# Patient Record
Sex: Male | Born: 1980
Health system: Southern US, Community
[De-identification: ages and names within clinical notes are randomized; demographics above are authoritative.]

---

## 2007-11-19 ENCOUNTER — Ambulatory Visit: Payer: Self-pay | Admitting: Family Medicine

## 2014-09-15 DIAGNOSIS — E785 Hyperlipidemia, unspecified: Secondary | ICD-10-CM | POA: Insufficient documentation

## 2016-08-04 DIAGNOSIS — F29 Unspecified psychosis not due to a substance or known physiological condition: Secondary | ICD-10-CM | POA: Insufficient documentation

## 2016-08-15 DIAGNOSIS — G4700 Insomnia, unspecified: Secondary | ICD-10-CM | POA: Diagnosis not present

## 2016-08-16 DIAGNOSIS — G4700 Insomnia, unspecified: Secondary | ICD-10-CM | POA: Diagnosis not present

## 2016-08-17 DIAGNOSIS — G4700 Insomnia, unspecified: Secondary | ICD-10-CM | POA: Diagnosis not present

## 2016-08-18 DIAGNOSIS — F2081 Schizophreniform disorder: Secondary | ICD-10-CM | POA: Insufficient documentation

## 2016-08-18 DIAGNOSIS — G4700 Insomnia, unspecified: Secondary | ICD-10-CM | POA: Diagnosis not present

## 2016-08-20 DIAGNOSIS — G4700 Insomnia, unspecified: Secondary | ICD-10-CM | POA: Diagnosis not present

## 2016-08-21 DIAGNOSIS — G4700 Insomnia, unspecified: Secondary | ICD-10-CM | POA: Diagnosis not present

## 2016-09-06 DIAGNOSIS — Z79899 Other long term (current) drug therapy: Secondary | ICD-10-CM | POA: Diagnosis not present

## 2017-05-17 DIAGNOSIS — R21 Rash and other nonspecific skin eruption: Secondary | ICD-10-CM | POA: Diagnosis not present

## 2017-06-14 DIAGNOSIS — J00 Acute nasopharyngitis [common cold]: Secondary | ICD-10-CM | POA: Diagnosis not present

## 2017-09-05 DIAGNOSIS — Z79899 Other long term (current) drug therapy: Secondary | ICD-10-CM | POA: Diagnosis not present

## 2018-03-22 DIAGNOSIS — R2232 Localized swelling, mass and lump, left upper limb: Secondary | ICD-10-CM | POA: Diagnosis not present

## 2018-03-22 DIAGNOSIS — A689 Relapsing fever, unspecified: Secondary | ICD-10-CM | POA: Diagnosis not present

## 2018-04-05 ENCOUNTER — Other Ambulatory Visit: Payer: Self-pay | Admitting: Orthopedic Surgery

## 2018-04-05 DIAGNOSIS — M25512 Pain in left shoulder: Secondary | ICD-10-CM | POA: Diagnosis not present

## 2018-04-05 DIAGNOSIS — R2232 Localized swelling, mass and lump, left upper limb: Secondary | ICD-10-CM

## 2018-04-05 DIAGNOSIS — G8929 Other chronic pain: Secondary | ICD-10-CM

## 2018-04-09 ENCOUNTER — Other Ambulatory Visit: Payer: Self-pay | Admitting: Orthopedic Surgery

## 2018-04-09 DIAGNOSIS — M25512 Pain in left shoulder: Principal | ICD-10-CM

## 2018-04-09 DIAGNOSIS — R2232 Localized swelling, mass and lump, left upper limb: Secondary | ICD-10-CM

## 2018-04-09 DIAGNOSIS — G8929 Other chronic pain: Secondary | ICD-10-CM

## 2018-04-25 ENCOUNTER — Other Ambulatory Visit: Payer: Self-pay | Admitting: Orthopedic Surgery

## 2018-04-26 ENCOUNTER — Other Ambulatory Visit: Payer: Self-pay | Admitting: Orthopedic Surgery

## 2018-04-26 ENCOUNTER — Ambulatory Visit: Admission: RE | Admit: 2018-04-26 | Payer: 59 | Source: Ambulatory Visit

## 2018-04-26 DIAGNOSIS — R2232 Localized swelling, mass and lump, left upper limb: Secondary | ICD-10-CM

## 2018-04-26 DIAGNOSIS — G8929 Other chronic pain: Secondary | ICD-10-CM

## 2018-04-26 DIAGNOSIS — M25512 Pain in left shoulder: Principal | ICD-10-CM

## 2018-05-10 ENCOUNTER — Ambulatory Visit
Admission: RE | Admit: 2018-05-10 | Discharge: 2018-05-10 | Disposition: A | Discharge status: Home / Self Care | Payer: 59 | Source: Ambulatory Visit | Attending: Orthopedic Surgery | Admitting: Orthopedic Surgery

## 2018-05-10 ENCOUNTER — Ambulatory Visit: Payer: 59

## 2018-05-10 DIAGNOSIS — R2232 Localized swelling, mass and lump, left upper limb: Secondary | ICD-10-CM | POA: Insufficient documentation

## 2018-05-10 DIAGNOSIS — M25512 Pain in left shoulder: Secondary | ICD-10-CM

## 2018-05-10 DIAGNOSIS — M65812 Other synovitis and tenosynovitis, left shoulder: Secondary | ICD-10-CM | POA: Insufficient documentation

## 2018-05-10 DIAGNOSIS — G8929 Other chronic pain: Secondary | ICD-10-CM

## 2018-05-10 MED ORDER — GADOBENATE DIMEGLUMINE 529 MG/ML IV SOLN
20.0000 mL | Freq: Once | INTRAVENOUS | Status: AC | PRN
  Administered 2018-05-10: 20 mL via INTRAVENOUS

## 2019-02-11 ENCOUNTER — Other Ambulatory Visit: Payer: Self-pay

## 2019-02-11 ENCOUNTER — Emergency Department
Admission: EM | Admit: 2019-02-11 | Discharge: 2019-02-11 | Disposition: A | Discharge status: Home / Self Care | Payer: 59 | Attending: Emergency Medicine | Admitting: Emergency Medicine

## 2019-02-11 DIAGNOSIS — R05 Cough: Secondary | ICD-10-CM | POA: Insufficient documentation

## 2019-02-11 DIAGNOSIS — Z5321 Procedure and treatment not carried out due to patient leaving prior to being seen by health care provider: Secondary | ICD-10-CM | POA: Insufficient documentation

## 2019-02-11 DIAGNOSIS — R5383 Other fatigue: Secondary | ICD-10-CM | POA: Diagnosis not present

## 2019-02-11 NOTE — ED Triage Notes (Signed)
Pt requesting a covid test, c/o cough, fatigue and has a coworkers wife was dx with covid.

## 2019-02-11 NOTE — ED Notes (Signed)
See triage note  States his co -workers wife tested positive for COVID  States he wants to be tested  States he felling fatigued  No fever

## 2020-08-15 HISTORY — PX: SHOULDER SURGERY: SHX246

## 2021-03-17 DIAGNOSIS — S42141A Displaced fracture of glenoid cavity of scapula, right shoulder, initial encounter for closed fracture: Secondary | ICD-10-CM | POA: Insufficient documentation

## 2021-03-19 ENCOUNTER — Ambulatory Visit
Admission: RE | Admit: 2021-03-19 | Discharge: 2021-03-19 | Disposition: A | Discharge status: Home / Self Care | Payer: 59 | Source: Ambulatory Visit | Attending: Orthopaedic Surgery | Admitting: Orthopaedic Surgery

## 2021-03-19 ENCOUNTER — Other Ambulatory Visit: Payer: Self-pay | Admitting: Orthopaedic Surgery

## 2021-03-19 ENCOUNTER — Other Ambulatory Visit: Payer: Self-pay

## 2021-03-19 ENCOUNTER — Other Ambulatory Visit (HOSPITAL_COMMUNITY): Payer: Self-pay | Admitting: Orthopaedic Surgery

## 2021-03-19 DIAGNOSIS — S42141A Displaced fracture of glenoid cavity of scapula, right shoulder, initial encounter for closed fracture: Secondary | ICD-10-CM | POA: Diagnosis not present

## 2021-03-19 IMAGING — CT CT SHOULDER*R* W/O CM
1 series · 12 of 14 positions shown, 15 images · non-contrast
Comparison: None.

CLINICAL DATA: Fell on [REDACTED] dislocating right shoulder

EXAM:
CT OF THE UPPER RIGHT EXTREMITY WITHOUT CONTRAST
TECHNIQUE: Multidetector CT imaging of the upper right extremity was performed
according to the standard protocol.

[Series 5: ax st · axial · 0.46mm/px · z∈[-293,-116]mm · 12 of 111 slices shown, 15 images]
[im 9/111  soft-tissue]
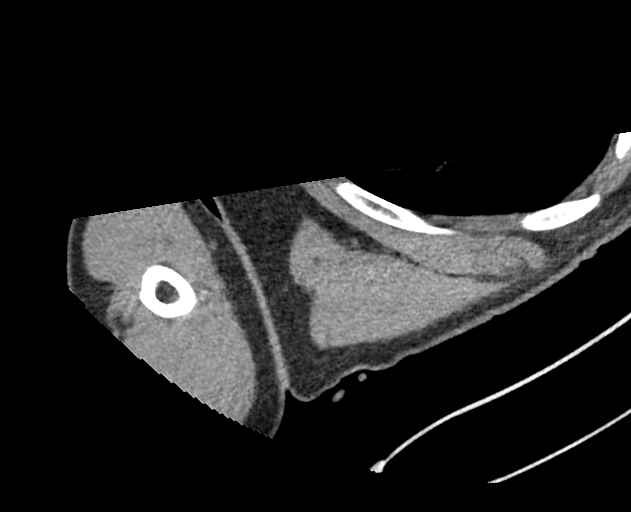
[im 9/111  bone]
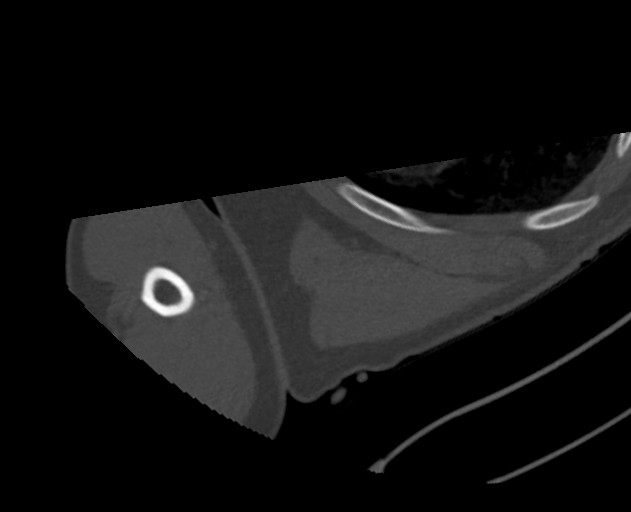
[im 17/111  bone]
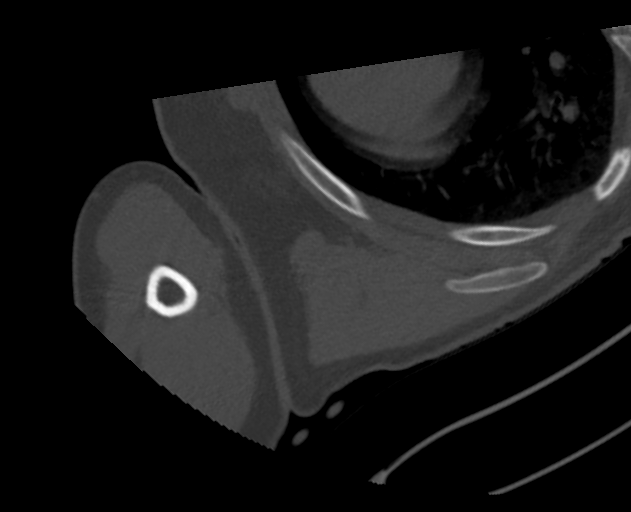
[im 26/111  bone]
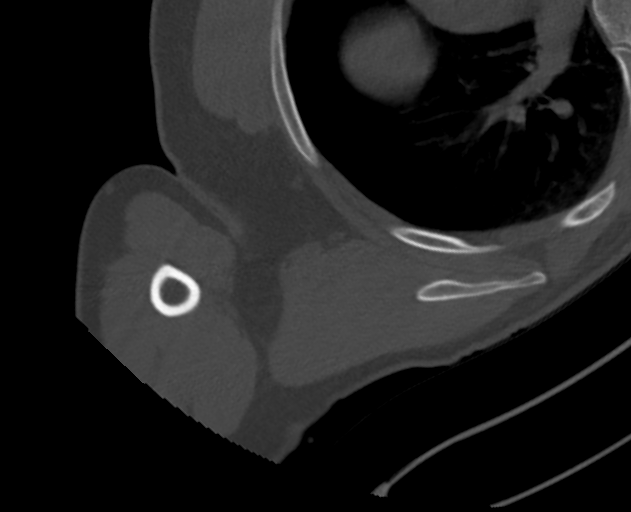
[im 34/111  bone]
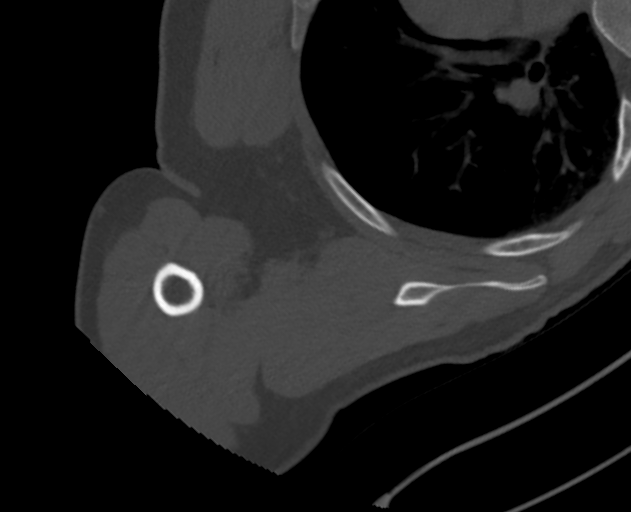
[im 43/111  soft-tissue]
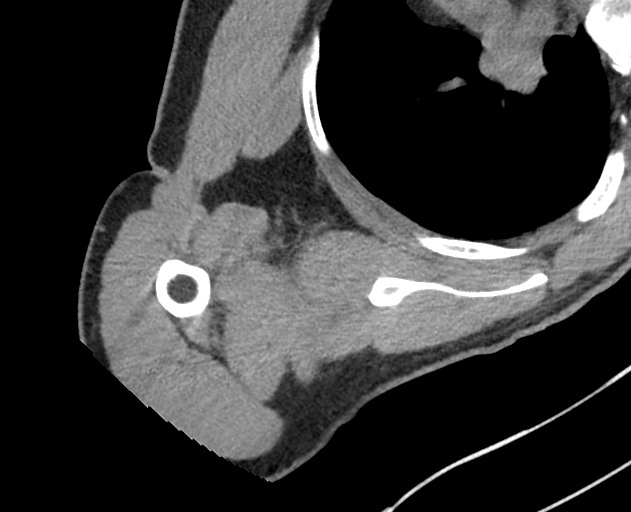
[im 43/111  bone]
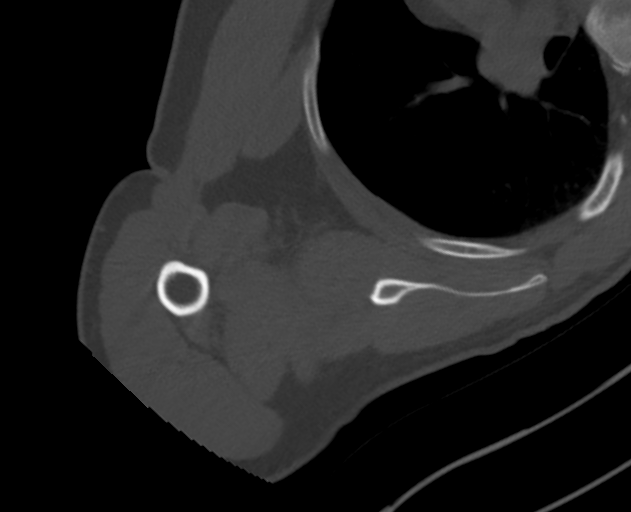
[im 51/111  bone]
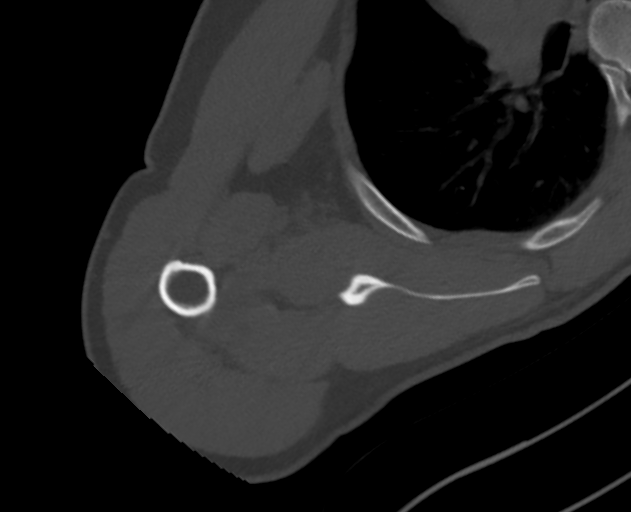
[im 60/111  bone]
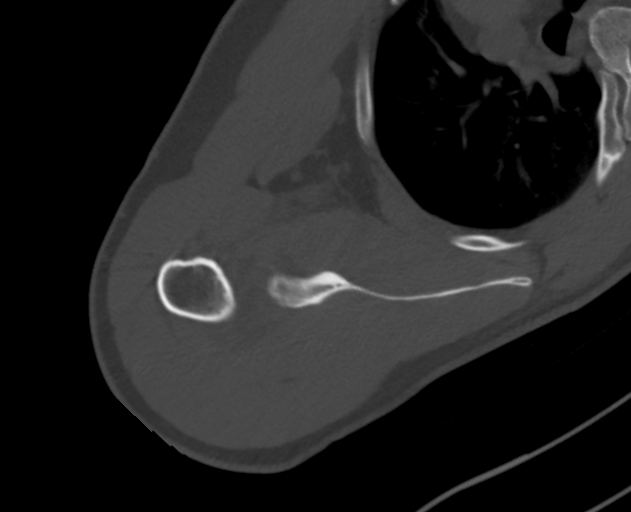
[im 68/111  bone]
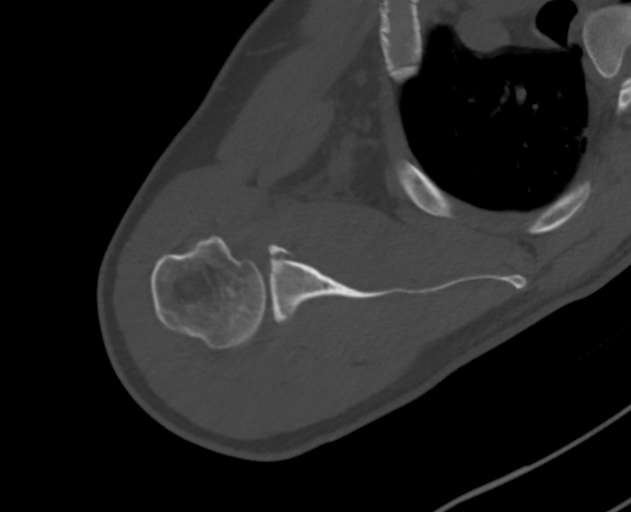
[im 77/111  soft-tissue]
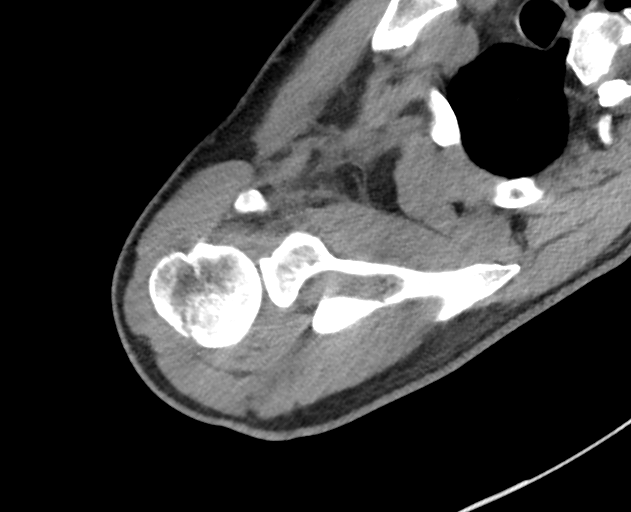
[im 77/111  bone]
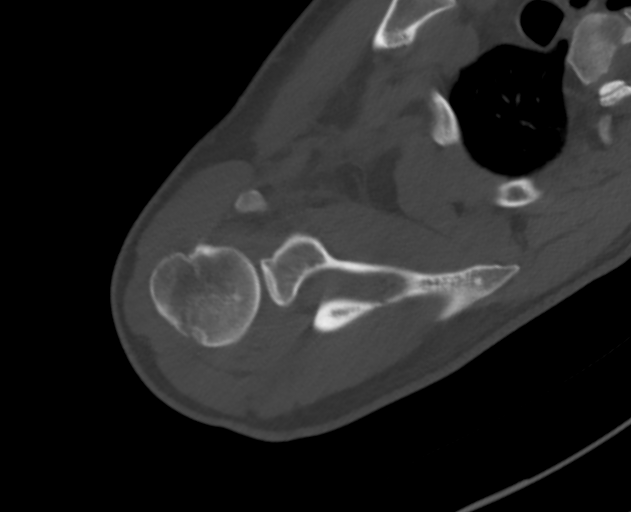
[im 85/111  bone]
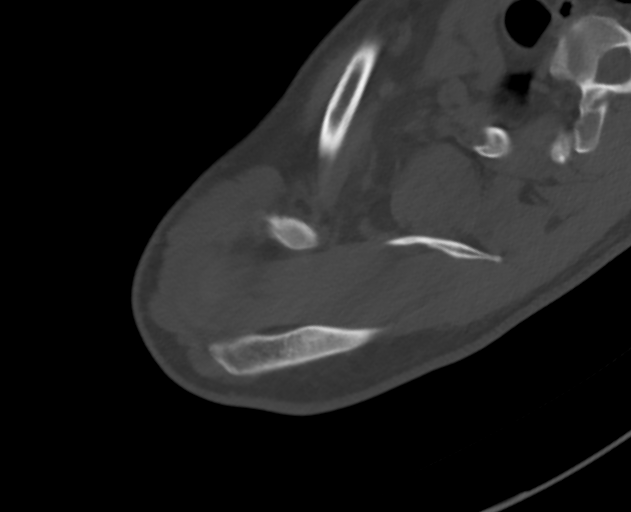
[im 94/111  bone]
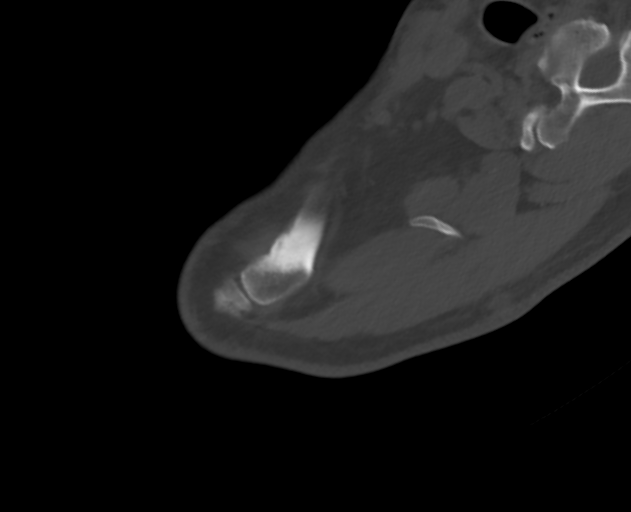
[im 102/111  bone]
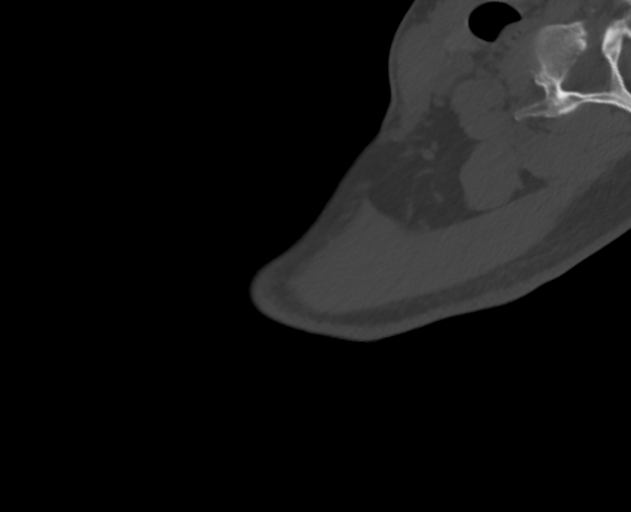

[12 of 14 positions shown; findings below may reference images not displayed]

FINDINGS: Bones/Joint/Cartilage

Normal glenohumeral alignment. There is a small Hill-Sachs deformity
along the posterolateral humeral head (axial image 34), which is
likely non engaging. There is a mildly displaced anterior inferior
bony Bankart injury and 2 mm depression of the articular surface.
The AC joint is well aligned. There is no significant degenerative
change. There is a small joint effusion.

Ligaments

Suboptimally assessed by CT.

Muscles and Tendons

No muscle atrophy.

Soft tissues

Soft tissue swelling in the right axilla.
IMPRESSION: Sequela of recent shoulder dislocation-relocation. Mildly displaced
anterior inferior bony Bankart injury of the glenoid and 2 mm
depression of the articular surface. Small Hill-Sachs deformity
which is likely non engaging. Small joint effusion. No muscle
atrophy.

## 2021-04-05 ENCOUNTER — Ambulatory Visit (HOSPITAL_COMMUNITY)
Admission: RE | Admit: 2021-04-05 | Discharge: 2021-04-05 | Disposition: A | Discharge status: Home / Self Care | Payer: 59 | Source: Ambulatory Visit | Attending: Orthopaedic Surgery | Admitting: Orthopaedic Surgery

## 2021-04-05 ENCOUNTER — Other Ambulatory Visit (HOSPITAL_COMMUNITY): Payer: Self-pay | Admitting: Orthopaedic Surgery

## 2021-04-05 DIAGNOSIS — T148XXA Other injury of unspecified body region, initial encounter: Secondary | ICD-10-CM | POA: Insufficient documentation

## 2021-05-03 DIAGNOSIS — Z9889 Other specified postprocedural states: Secondary | ICD-10-CM | POA: Insufficient documentation

## 2023-09-27 ENCOUNTER — Encounter: Payer: Self-pay | Admitting: Emergency Medicine

## 2023-09-27 ENCOUNTER — Ambulatory Visit
Admission: EM | Admit: 2023-09-27 | Discharge: 2023-09-27 | Disposition: A | Discharge status: Home / Self Care | Payer: 59 | Attending: Emergency Medicine | Admitting: Emergency Medicine

## 2023-09-27 DIAGNOSIS — S01511A Laceration without foreign body of lip, initial encounter: Secondary | ICD-10-CM

## 2023-09-27 DIAGNOSIS — R55 Syncope and collapse: Secondary | ICD-10-CM | POA: Diagnosis not present

## 2023-09-27 MED ORDER — AMOXICILLIN-POT CLAVULANATE 875-125 MG PO TABS: 1.0000 | ORAL_TABLET | Freq: Two times a day (BID) | ORAL | 0 refills | Status: AC

## 2023-09-27 NOTE — ED Triage Notes (Signed)
Pt states he passed out last night when getting up to use the bathroom, pt states he felt like he was going to vomit then passed out when he was sitting on toilet. Pt states he hit his mouth and chin but no laceration or bruise to head that he knows of.   Pt states he has a history of passing out like this when younger.

## 2023-09-27 NOTE — Discharge Instructions (Addendum)
As we discussed, your EKG shows normal sinus rhythm.  It does show a slight conduction delay called a first-degree AV block.  This does not typically cause you to have fainting spells however.  I want you to call and schedule follow-up with your primary care provider Dr. Maryjane Hurter and ask for a referral to cardiology regarding your syncopal event and first-degree AV block.  You did sustain a laceration to the inside and outside of your lower lip on the right-hand side and you do need antibiotics to prevent infection.  Please take the Augmentin 875 mg twice daily with food for 7 days to prevent infection.  Rinse with warm water or Listerine following meals to ensure that no food particles get stuck in the open wound on the inside of your lip.  If you have any return of shortness of breath, nausea, or develop chest pain, especially with radiation to your left shoulder or left jaw, headache, or change in vision you need to call 911 and go to the ER immediately.  Do not attempt to drive yourself.

## 2023-09-27 NOTE — ED Provider Notes (Signed)
[facility]    CSN: [id number] Arrival date & time: 09/27/23  1443      History   Chief Complaint Chief Complaint  Patient presents with   Fall    HPI Nathaniel Orr is a 43 y.o. male.   HPI  43 year old male with a past medical history significant for sleep apnea on CPAP presents for evaluation of syncope.  He reports that she has been having difficulty with his CPAP machine and has not been wearing it like he is supposed to.  Last night he woke up because he felt short of breath and also nauseous.  He got up to go to the bathroom and while walking to the bathroom suffered a syncopal event and hit the cold tile floor.  Unsure of how long he was out.  He states that his mother, whom he lives with, came to check on him and by the time she arrived he was awake and alert.  He states he has had issues with syncope in the past.  He states that the time he had some dizziness and some visual changes but he states that he is not having any of those now.  He also denies chest pain.  History reviewed. No pertinent past medical history.  There are no active problems to display for this patient.   Past Surgical History:  Procedure Laterality Date   SHOULDER SURGERY Right 2022       Home Medications    Prior to Admission medications   Medication Sig Start Date End Date Taking? Authorizing Provider  amoxicillin-clavulanate (AUGMENTIN) 875-125 MG tablet Take 1 tablet by mouth every 12 (twelve) hours for 7 days. 09/27/23 10/04/23 Yes Becky Augusta, NP  QUEtiapine (SEROQUEL) 200 MG tablet Take 200 mg by mouth at bedtime. 09/25/23  Yes [provider]    Family History History reviewed. No pertinent family history.  Social History Social History   Tobacco Use   Smoking status: Never   Smokeless tobacco: Never  Vaping Use   Vaping status: Never Used  Substance Use Topics   Alcohol use: Yes    Comment: rare occ   Drug use: Not Currently     Allergies    Codeine   Review of Systems Review of Systems  Eyes:  Positive for visual disturbance.  Respiratory:  Positive for shortness of breath.   Cardiovascular:  Negative for chest pain.  Gastrointestinal:  Positive for nausea.  Neurological:  Positive for dizziness and syncope. Negative for headaches.     Physical Exam Triage Vital Signs ED Triage Vitals  Encounter Vitals Group     BP      Systolic BP Percentile      Diastolic BP Percentile      Pulse      Resp      Temp      Temp src      SpO2      Weight      Height      Head Circumference      Peak Flow      Pain Score      Pain Loc      Pain Education      Exclude from Growth Chart    No data found.  Updated Vital Signs BP (!) 144/100 (BP Location: Left Arm)   Pulse 81   Temp 98.8 F (37.1 C) (Oral)   Resp 18   SpO2 98%   Visual Acuity Right Eye Distance:  Left Eye Distance:   Bilateral Distance:    Right Eye Near:   Left Eye Near:    Bilateral Near:     Physical Exam Vitals and nursing note reviewed.  Constitutional:      Appearance: Normal appearance. He is not ill-appearing.  HENT:     Head: Normocephalic and atraumatic.     Mouth/Throat:     Mouth: Mucous membranes are moist.     Pharynx: Oropharynx is clear. Posterior oropharyngeal erythema present. No oropharyngeal exudate.     Comments: Patient has wound on the inside of his lower lip on the right and wound to the outside of his lower lip to the right.  No communication noted on exam.  No active bleeding. Eyes:     General: No scleral icterus.    Extraocular Movements: Extraocular movements intact.     Conjunctiva/sclera: Conjunctivae normal.     Pupils: Pupils are equal, round, and reactive to light.  Cardiovascular:     Rate and Rhythm: Normal rate and regular rhythm.     Pulses: Normal pulses.     Heart sounds: Normal heart sounds. No murmur heard.    No friction rub. No gallop.  Pulmonary:     Effort: Pulmonary effort is normal.      Breath sounds: Normal breath sounds. No wheezing, rhonchi or rales.  Skin:    General: Skin is warm and dry.     Capillary Refill: Capillary refill takes less than 2 seconds.  Neurological:     General: No focal deficit present.     Mental Status: He is alert and oriented to person, place, and time.      UC Treatments / Results  Labs (all labs ordered are listed, but only abnormal results are displayed) Labs Reviewed - No data to display  EKG Normal sinus rhythm with a ventricular rate of 75 bpm PR interval 202 ms QRS duration 96 ms QT/QTc 348/388 ms No ST or T wave abnormalities noted.  Radiology No results found.  Procedures Procedures (including critical care time)  Medications Ordered in UC Medications - No data to display  Initial Impression / Assessment and Plan / UC Course  I have reviewed the triage vital signs and the nursing notes.  Pertinent labs & imaging results that were available during my care of the patient were reviewed by me and considered in my medical decision making (see chart for details).   Patient is a nontoxic-appearing 43 year old male presenting for evaluation of a syncopal episode that happened last night.  He reports that the time he was asleep in bed with his CPAP on and he woke up because he felt short of breath and nauseous.  He got up to walk to the bathroom, suffered a syncopal event, and woke up on the floor of the bathroom.  His mom, whom she lives with, came to check on him and he was already alert and awake when she arrived.  The patient on exam has a scabbed wound on the outside of the right lower lip below the vermilion border and a wound on the inside of the lower lip that corresponds.  I do not appreciate communication between the 2 injuries.  However, I will discharge patient home on Augmentin 875 twice daily for 7 days to prevent infection.  At the time of his syncopal event he did report some dizziness and visual disturbance but he  could not describe if it was blurry vision or double vision.  Those have  all resolved.  His EKG shows normal sinus rhythm with a first-degree AV block.  No other tracings are available for comparison in epic though patient does have an EKG in the system from 2020 which did not show first-degree AV block at that time.  The patient is also hypertensive here in clinic with a blood pressure 144/100.  No previous history of hypertension though I suspect is secondary to his poorly treated CPAP.  I will have the patient follow-up with his primary care provider, Dr. Maryjane Hurter at [facility], and also with cardiology at [facility] clinic regarding his symptoms.  ER and return precautions reviewed.   Final Clinical Impressions(s) / UC Diagnoses   Final diagnoses:  Syncope, unspecified syncope type  Lip laceration, initial encounter     Discharge Instructions      As we discussed, your EKG shows normal sinus rhythm.  It does show a slight conduction delay called a first-degree AV block.  This does not typically cause you to have fainting spells however.  I want you to call and schedule follow-up with your primary care provider Dr. Maryjane Hurter and ask for a referral to cardiology regarding your syncopal event and first-degree AV block.  You did sustain a laceration to the inside and outside of your lower lip on the right-hand side and you do need antibiotics to prevent infection.  Please take the Augmentin 875 mg twice daily with food for 7 days to prevent infection.  Rinse with warm water or Listerine following meals to ensure that no food particles get stuck in the open wound on the inside of your lip.  If you have any return of shortness of breath, nausea, or develop chest pain, especially with radiation to your left shoulder or left jaw, headache, or change in vision you need to call 911 and go to the ER immediately.  Do not attempt to drive yourself.     ED Prescriptions     Medication Sig  Dispense Auth. Provider   amoxicillin-clavulanate (AUGMENTIN) 875-125 MG tablet Take 1 tablet by mouth every 12 (twelve) hours for 7 days. 14 tablet Becky Augusta, NP      PDMP not reviewed this encounter.   Becky Augusta, NP 09/27/23 1600

## 2024-07-15 ENCOUNTER — Ambulatory Visit: Payer: Self-pay | Admitting: Emergency Medicine

## 2024-07-15 ENCOUNTER — Ambulatory Visit
Admission: EM | Admit: 2024-07-15 | Discharge: 2024-07-15 | Disposition: A | Discharge status: Home / Self Care | Attending: Emergency Medicine | Admitting: Emergency Medicine

## 2024-07-15 DIAGNOSIS — Z20822 Contact with and (suspected) exposure to covid-19: Secondary | ICD-10-CM | POA: Diagnosis not present

## 2024-07-15 DIAGNOSIS — J069 Acute upper respiratory infection, unspecified: Secondary | ICD-10-CM

## 2024-07-15 DIAGNOSIS — G4730 Sleep apnea, unspecified: Secondary | ICD-10-CM | POA: Insufficient documentation

## 2024-07-15 DIAGNOSIS — R569 Unspecified convulsions: Secondary | ICD-10-CM | POA: Insufficient documentation

## 2024-07-15 DIAGNOSIS — F411 Generalized anxiety disorder: Secondary | ICD-10-CM | POA: Insufficient documentation

## 2024-07-15 LAB — POC SOFIA SARS ANTIGEN FIA: SARS Coronavirus 2 Ag: NEGATIVE

## 2024-07-15 MED ORDER — FLUTICASONE PROPIONATE 50 MCG/ACT NA SUSP: 2.0000 | Freq: Every day | NASAL | 0 refills | Status: DC

## 2024-07-15 MED ORDER — PROMETHAZINE-DM 6.25-15 MG/5ML PO SYRP: 5.0000 mL | ORAL_SOLUTION | Freq: Four times a day (QID) | ORAL | 0 refills | Status: DC | PRN

## 2024-07-15 MED ORDER — IBUPROFEN 600 MG PO TABS: 600.0000 mg | ORAL_TABLET | Freq: Four times a day (QID) | ORAL | 0 refills | Status: DC | PRN

## 2024-07-15 NOTE — Discharge Instructions (Signed)
 We will contact you if and only if your COVID is positive.  Start Mucinex-D to keep the mucous thin and to decongest you.   You may take 600 mg of motrin with 1000 mg of tylenol up to 3-4 times a day as needed for pain. This is an effective combination for pain.  Most respiratory infections are viral and do not need antibiotics unless you have a high fever, have had this for 10 days, or you get better and then get sick again. Use a NeilMed sinus rinse with distilled water as often as you want to to reduce nasal congestion. Follow the directions on the box.  Flonase for nasal congestion and postnasal drip.  Continue Zicam.  Promethazine DM in case you develop a cough.  Go to www.goodrx.com to look up your medications. This will give you a list of where you can find your prescriptions at the most affordable prices. Or you can ask the pharmacist what the cash price is. This is frequently cheaper than going through insurance.

## 2024-07-15 NOTE — ED Provider Notes (Signed)
 HPI  SUBJECTIVE:  Nathaniel Orr is a 43 y.o. male who presents with 4 days of feeling feverish with chills, body aches, nasal congestion, clear rhinorrhea, questionable postnasal drip, sore throat from coughing and a cough productive of clear phlegm.  No headaches, sinus pain or pressure, wheezing or shortness of breath, dyspnea on exertion, nausea, vomiting, diarrhea, abdominal pain.  He is able to sleep at night without waking up coughing.  No known COVID or flu exposure.  He had 2 doses of COVID-vaccine.  Did not get this years flu vaccine.  He has tried Mucinex day and night, Zicam with improvement in his symptoms.  Symptoms are worse with exposure to cold weather.  No antibiotics in the past month.  He has no past medical history.  [facility]    History reviewed. No pertinent past medical history.  Past Surgical History:  Procedure Laterality Date   SHOULDER SURGERY Right 2022    History reviewed. No pertinent family history.  Social History   Tobacco Use   Smoking status: Never   Smokeless tobacco: Never  Vaping Use   Vaping status: Never Used  Substance Use Topics   Alcohol use: Yes    Comment: rare occ   Drug use: Not Currently    No current facility-administered medications for this encounter.  Current Outpatient Medications:    fluticasone (FLONASE) 50 MCG/ACT nasal spray, Place 2 sprays into both nostrils daily., Disp: 16 g, Rfl: 0   ibuprofen (ADVIL) 600 MG tablet, Take 1 tablet (600 mg total) by mouth every 6 (six) hours as needed., Disp: 30 tablet, Rfl: 0   promethazine-dextromethorphan (PROMETHAZINE-DM) 6.25-15 MG/5ML syrup, Take 5 mLs by mouth 4 (four) times daily as needed for cough., Disp: 118 mL, Rfl: 0   QUEtiapine (SEROQUEL) 200 MG tablet, Take 200 mg by mouth at bedtime., Disp: , Rfl:   Allergies  Allergen Reactions   Codeine Other (See Comments)     ROS  As noted in HPI.   Physical Exam  BP (!) 137/93 (BP Location:  Left Arm)   Pulse 70   Temp 98.2 F (36.8 C) (Oral)   Resp 18   Wt 97.2 kg   SpO2 98%   BMI 27.50 kg/m   Constitutional: Well developed, well nourished, no acute distress Eyes: PERRL, EOMI, conjunctiva normal bilaterally HENT: Normocephalic, atraumatic,mucus membranes moist.  Clear nasal congestion.  Erythematous, swollen turbinates.  No maxillary, frontal sinus tenderness.  Normal oropharynx.  Positive postnasal drip. Neck: No cervical lymphadenopathy Respiratory: Clear to auscultation bilaterally, no rales, no wheezing, no rhonchi.  No anterior, lateral chest wall tenderness Cardiovascular: Normal rate and rhythm, no murmurs, no gallops, no rubs GI: nondistended skin: No rash, skin intact Musculoskeletal: no deformities Neurologic: Alert & oriented x 3, CN III-XII grossly intact, no motor deficits, sensation grossly intact Psychiatric: Speech and behavior appropriate   ED Course   Medications - No data to display  Orders Placed This Encounter  Procedures   POC SARS Coronavirus Ag    Standing Status:   Standing    Number of Occurrences:   1   No results found for this or any previous visit (from the past 24 hours). No results found.  ED Clinical Impression  1. Upper respiratory tract infection, unspecified type   2. Encounter for laboratory testing for COVID-19 virus      ED Assessment/Plan     Checking COVID is he would need to mask for an additional 6 days  around others at all times.  He is fully vaccinated.  It is too late to do anything for influenza, flu testing was not done.  Patient presents with an upper respiratory infection.  No indications for antibiotics at this time.  Home with Mucinex D, Tylenol/ibuprofen, saline nasal patient, Flonase, Promethazine DM in case he develops a cough, continue Zicam.  Work note stating he can return after he has been fever free without the use of antipyretics for 24 hours.  Will contact patient at [phone] if COVID  comes back positive.  Discussed labs,MDM, treatment plan, and plan for follow-up with patient Discussed patient agrees with plan.   Meds ordered this encounter  Medications   fluticasone (FLONASE) 50 MCG/ACT nasal spray    Sig: Place 2 sprays into both nostrils daily.    Dispense:  16 g    Refill:  0   ibuprofen (ADVIL) 600 MG tablet    Sig: Take 1 tablet (600 mg total) by mouth every 6 (six) hours as needed.    Dispense:  30 tablet    Refill:  0   promethazine-dextromethorphan (PROMETHAZINE-DM) 6.25-15 MG/5ML syrup    Sig: Take 5 mLs by mouth 4 (four) times daily as needed for cough.    Dispense:  118 mL    Refill:  0      *This clinic note was created using Scientist, clinical (histocompatibility and immunogenetics). Therefore, there may be occasional mistakes despite careful proofreading. ?    Van Knee, MD 07/18/24 [phone]

## 2024-07-15 NOTE — ED Triage Notes (Signed)
 Pt c/o cough,congestion,chills & bodyaches x3 days. Has tried OTC meds w/o relief.

## 2024-07-25 ENCOUNTER — Ambulatory Visit
Admission: RE | Admit: 2024-07-25 | Discharge: 2024-07-25 | Disposition: A | Discharge status: Home / Self Care | Source: Ambulatory Visit | Attending: Emergency Medicine | Admitting: Emergency Medicine

## 2024-07-25 VITALS — BP 137/91 | HR 79 | Temp 98.4°F

## 2024-07-25 DIAGNOSIS — R5383 Other fatigue: Secondary | ICD-10-CM

## 2024-07-25 LAB — CBC WITH DIFFERENTIAL/PLATELET
Abs Immature Granulocytes: 0 K/uL (ref 0.00–0.07)
Basophils Absolute: 0.1 K/uL (ref 0.0–0.1)
Basophils Relative: 1 %
Eosinophils Absolute: 0.2 K/uL (ref 0.0–0.5)
Eosinophils Relative: 3 %
HCT: 45.2 % (ref 39.0–52.0)
Hemoglobin: 15.7 g/dL (ref 13.0–17.0)
Immature Granulocytes: 0 %
Lymphocytes Relative: 27 %
Lymphs Abs: 2.1 K/uL (ref 0.7–4.0)
MCH: 32.6 pg (ref 26.0–34.0)
MCHC: 34.7 g/dL (ref 30.0–36.0)
MCV: 94 fL (ref 80.0–100.0)
Monocytes Absolute: 0.5 K/uL (ref 0.1–1.0)
Monocytes Relative: 7 %
Neutro Abs: 4.8 K/uL (ref 1.7–7.7)
Neutrophils Relative %: 62 %
Platelets: 358 K/uL (ref 150–400)
RBC: 4.81 MIL/uL (ref 4.22–5.81)
RDW: 12 % (ref 11.5–15.5)
WBC: 7.7 K/uL (ref 4.0–10.5)
nRBC: 0 % (ref 0.0–0.2)
nRBC: 0 /100{WBCs}

## 2024-07-25 LAB — POCT URINE DIPSTICK
Bilirubin, UA: NEGATIVE
Blood, UA: NEGATIVE
Glucose, UA: NEGATIVE mg/dL
Ketones, POC UA: NEGATIVE mg/dL
Leukocytes, UA: NEGATIVE
Nitrite, UA: NEGATIVE
Protein Ur, POC: NEGATIVE mg/dL
Spec Grav, UA: 1.025 (ref 1.010–1.025)
Urobilinogen, UA: 1 U/dL
pH, UA: 6.5 (ref 5.0–8.0)

## 2024-07-25 LAB — COMPREHENSIVE METABOLIC PANEL WITH GFR
ALT: 33 U/L (ref 0–44)
AST: 21 U/L (ref 15–41)
Albumin: 4 g/dL (ref 3.5–5.0)
Alkaline Phosphatase: 85 U/L (ref 38–126)
Anion gap: 9 (ref 5–15)
BUN: 16 mg/dL (ref 6–20)
CO2: 25 mmol/L (ref 22–32)
Calcium: 9.2 mg/dL (ref 8.9–10.3)
Chloride: 106 mmol/L (ref 98–111)
Creatinine, Ser: 1.1 mg/dL (ref 0.61–1.24)
GFR, Estimated: >60 mL/min (ref >60)
Glucose, Bld: 97 mg/dL (ref 70–99)
Potassium: 4.7 mmol/L (ref 3.5–5.1)
Sodium: 140 mmol/L (ref 135–145)
Total Bilirubin: 0.8 mg/dL (ref 0.0–1.2)
Total Protein: 7.3 g/dL (ref 6.5–8.1)

## 2024-07-25 LAB — TSH: TSH: 1.815 u[IU]/mL (ref 0.350–4.500)

## 2024-07-25 NOTE — Discharge Instructions (Signed)
 I am checking a CBC to evaluate for anemia, TSH to look for thyroid  problems, a comprehensive metabolic panel to look at your kidney and liver functions, and a urinalysis.  We will contact you with any abnormal labs that need to be addressed emergently.  Otherwise, all other abnormalities will need to be followed up with by your primary care provider.  Please give them a call and schedule an appointment with them as soon as possible.

## 2024-07-25 NOTE — ED Provider Notes (Signed)
 HPI  SUBJECTIVE:  Nathaniel Orr is a 43 y.o. male who presents with intermittent waves of fatigue for the past year.  States that it completely resolves and then returns.  It returned 1 to 2 months ago.  He states it is random, last 20 minutes to 3 hours.  States that his limbs feel heavy and that it requires more effort to perform normal activities.  He states that it is getting difficult to function at work at the expected pace.  He denies palpitations, chest pain, shortness of breath, abdominal pain, unintentional weight changes, temperature intolerance, skin or hair changes, hematuria, melena, hematochezia.  He reports normal appetite.  Denies night sweats.  He has tried warm showers, energy drinks and coffee.  Caffeine is no longer helping.  There are no aggravating factors.  It is not associated with activity, stress.  His psychiatrist decreased his Seroquel about 6 months ago to mitigate the fatigue without results.  There are no other change in his medications or new medications.  Patient has a past medical history of seizures, schizophreniform disorder, hyperlipidemia, remote history of psychosis, vasovagal syncope, sleep apnea.. states that he no longer uses the CPAP anymore as it keeps him awake at night.  States that he is sleeping well now without it.  States that he now takes Seroquel only for sleep.  Denies history of thyroid  disease, diabetes, hypertension, coronary disease, cancer, anemia, CHF, arrhythmia.  Family history negative for muscular dystrophy, periodic hypokalemia.  [facility].  He has not yet discussed the symptoms with his PCP.  History reviewed. No pertinent past medical history.  Past Surgical History:  Procedure Laterality Date   SHOULDER SURGERY Right 2022    History reviewed. No pertinent family history.  Social History[1]  Current Medications[2]  Allergies[3]   ROS  As noted in HPI.   Physical Exam  BP (!) 137/91 (BP  Location: Left Arm)   Pulse 79   Temp 98.4 F (36.9 C) (Oral)   SpO2 95%   Constitutional: Well developed, well nourished, no acute distress Eyes: PERRL, EOMI, conjunctiva normal bilaterally HENT: Normocephalic, atraumatic,mucus membranes moist Neck: No thyroid  tenderness, thyromegaly Respiratory: Clear to auscultation bilaterally, no rales, no wheezing, no rhonchi Cardiovascular: Normal rate and rhythm, no murmurs, no gallops, no rubs GI: Soft, nondistended, normal bowel sounds, nontender, no rebound, no guarding Back: no CVAT skin: No rash, skin intact Musculoskeletal: No edema, no tenderness, no deformities Neurologic: Alert & oriented x 3, CN III-XII grossly intact, no motor deficits, sensation grossly intact.  Brachioradialis, patellar reflexes 2/2+ and equal bilaterally. Psychiatric: Speech and behavior appropriate   ED Course   Medications - No data to display  Orders Placed This Encounter  Procedures   Comprehensive metabolic panel    Standing Status:   Standing    Number of Occurrences:   1   CBC with Differential    Standing Status:   Standing    Number of Occurrences:   1   TSH    Standing Status:   Standing    Number of Occurrences:   1   POC Urinalysis Dipstick    Standing Status:   Standing    Number of Occurrences:   1   Results for orders placed or performed during the hospital encounter of 07/25/24 (from the past 24 hours)  Comprehensive metabolic panel     Status: None   Collection Time: 07/25/24  6:21 PM  Result Value Ref Range   Sodium 140  135 - 145 mmol/L   Potassium 4.7 3.5 - 5.1 mmol/L   Chloride 106 98 - 111 mmol/L   CO2 25 22 - 32 mmol/L   Glucose, Bld 97 70 - 99 mg/dL   BUN 16 6 - 20 mg/dL   Creatinine, Ser 8.89 0.61 - 1.24 mg/dL   Calcium 9.2 8.9 - 89.6 mg/dL   Total Protein 7.3 6.5 - 8.1 g/dL   Albumin 4.0 3.5 - 5.0 g/dL   AST 21 15 - 41 U/L   ALT 33 0 - 44 U/L   Alkaline Phosphatase 85 38 - 126 U/L   Total Bilirubin 0.8 0.0 - 1.2  mg/dL   GFR, Estimated >39 >39 mL/min   Anion gap 9 5 - 15  CBC with Differential     Status: None   Collection Time: 07/25/24  6:21 PM  Result Value Ref Range   WBC 7.7 4.0 - 10.5 K/uL   RBC 4.81 4.22 - 5.81 MIL/uL   Hemoglobin 15.7 13.0 - 17.0 g/dL   HCT 54.7 60.9 - 47.9 %   MCV 94.0 80.0 - 100.0 fL   MCH 32.6 26.0 - 34.0 pg   MCHC 34.7 30.0 - 36.0 g/dL   RDW 87.9 88.4 - 84.4 %   Platelets 358 150 - 400 K/uL   nRBC 0 0.0 - 0.2 %   Neutrophils Relative % 62 %   Neutro Abs 4.8 1.7 - 7.7 K/uL   Lymphocytes Relative 27 %   Lymphs Abs 2.1 0.7 - 4.0 K/uL   Monocytes Relative 7 %   Monocytes Absolute 0.5 0.1 - 1.0 K/uL   Eosinophils Relative 3 %   Eosinophils Absolute 0.2 0.0 - 0.5 K/uL   Basophils Relative 1 %   Basophils Absolute 0.1 0.0 - 0.1 K/uL   nRBC 0 0 /100 WBC   Immature Granulocytes 0 %   Abs Immature Granulocytes 0.00 0.00 - 0.07 K/uL  TSH     Status: None   Collection Time: 07/25/24  6:21 PM  Result Value Ref Range   TSH 1.815 0.350 - 4.500 uIU/mL  POC Urinalysis Dipstick     Status: Normal   Collection Time: 07/25/24  6:33 PM  Result Value Ref Range   Color, UA yellow yellow   Clarity, UA clear clear   Glucose, UA negative negative mg/dL   Bilirubin, UA negative negative   Ketones, POC UA negative negative mg/dL   Spec Grav, UA 8.974 8.989 - 1.025   Blood, UA negative negative   pH, UA 6.5 5.0 - 8.0   Protein Ur, POC negative negative mg/dL   Urobilinogen, UA 1.0 0.2 or 1.0 E.U./dL   Nitrite, UA Negative Negative   Leukocytes, UA Negative Negative   No results found.  ED Clinical Impression  1. Other fatigue      ED Assessment/Plan     Previous and outside records reviewed.  Additional medical history obtained.  As noted in HPI.  Discussed with patient that we have limited resources here, but I am happy to initiate a basic workup looking for common causes of fatigue such as anemia, hypothyroidism, electrolyte imbalance, liver disease, kidney  disease.  TSH, CBC, CMP, UA.  Rare causes, such as autoimmune issues, intermittent arrhythmias, or musculoskeletal issues, will need to be investigated by his PCP.  Depression also the differential.  Will contact patient with any abnormal labs that need emergent addressing.  He will need to follow-up with his primary care provider for other abnormalities and  further investigation.  Last EKG done in March in the ED was normal.  Labs reviewed.  UA CBC, CMP, TSH normal.  Discussed labs, MDM, treatment plan, and plan for follow-up with patient Discussed sn/sx that should prompt return to the ED. patient agrees with plan.   No orders of the defined types were placed in this encounter.     *This clinic note was created using Dragon dictation software. Therefore, there may be occasional mistakes despite careful proofreading. ?     [1]  Social History Tobacco Use   Smoking status: Never   Smokeless tobacco: Never  Vaping Use   Vaping status: Never Used  Substance Use Topics   Alcohol use: Yes    Comment: rare occ   Drug use: Not Currently  [2] No current facility-administered medications for this encounter.  Current Outpatient Medications:    QUEtiapine (SEROQUEL) 200 MG tablet, Take 200 mg by mouth at bedtime., Disp: , Rfl:  [3]  Allergies Allergen Reactions   Codeine Other (See Comments)     Van Knee, MD 07/26/24 1326

## 2024-07-25 NOTE — ED Triage Notes (Addendum)
 Pt c/o waves of fatigue x1-24months  Pt states that he spoke to his psychiatrist and has had medication changes but it has not helped. Pt has had reduced seroquel.   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   Pt denies pain, but states that he is having discomfort  Pt states that he had similar symptoms a year ago  Pt denies changes of physical activity, illness, or joint pain.

## 2024-07-26 ENCOUNTER — Ambulatory Visit (HOSPITAL_COMMUNITY): Payer: Self-pay
